# Patient Record
Sex: Male | Born: 1960 | Race: Black or African American | Hispanic: No | Marital: Married | Smoking: Never smoker
Health system: Southern US, Community
[De-identification: ages and names within clinical notes are randomized; demographics above are authoritative.]

## PROBLEM LIST (undated history)

## (undated) DIAGNOSIS — E7800 Pure hypercholesterolemia, unspecified: Secondary | ICD-10-CM

## (undated) DIAGNOSIS — I10 Essential (primary) hypertension: Secondary | ICD-10-CM

## (undated) HISTORY — PX: HERNIA REPAIR: SHX51

## (undated) HISTORY — DX: Pure hypercholesterolemia, unspecified: E78.00

## (undated) HISTORY — DX: Essential (primary) hypertension: I10

---

## 2006-04-12 ENCOUNTER — Encounter: Admission: RE | Admit: 2006-04-12 | Discharge: 2006-04-12 | Payer: Self-pay | Admitting: General Surgery

## 2006-04-14 ENCOUNTER — Encounter: Admission: RE | Admit: 2006-04-14 | Discharge: 2006-04-14 | Payer: Self-pay | Admitting: General Surgery

## 2006-04-30 ENCOUNTER — Ambulatory Visit: Payer: Self-pay | Admitting: Internal Medicine

## 2006-04-30 LAB — CONVERTED CEMR LAB: Sed Rate: 11 mm/hr (ref 0–20)

## 2009-02-04 IMAGING — CT CT CHEST W/ CM
2 of 3 series · 15 of 36 positions shown, 18 images · IV contrast (75CC OMNI 300)
Comparison: No prior chest CT. Two-view chest x-ray 04/02/2006 correlated.

CLINICAL DATA: Recent abnormal chest x-ray questioning mediastinal
lymphadenopathy. Smoker who fell 2 weeks ago and complains of left lateral chest
pain.

CT CHEST WITH CONTRAST  04/14/2006:
TECHNIQUE: Multidetector CT imaging of the chest and upper abdomen was
performed during bolus administration of intravenous contrast.
Contrast:  75 cc Omnipaque 300.

[Series 3: routine chest · axial · 0.62mm/px · z∈[-336,-66]mm · 12 of 64 slices shown, 15 images]
[im 5/64  mediastinal]
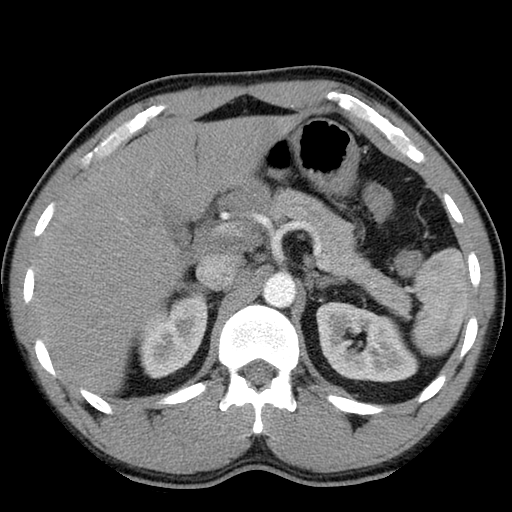
[im 5/64  lung]
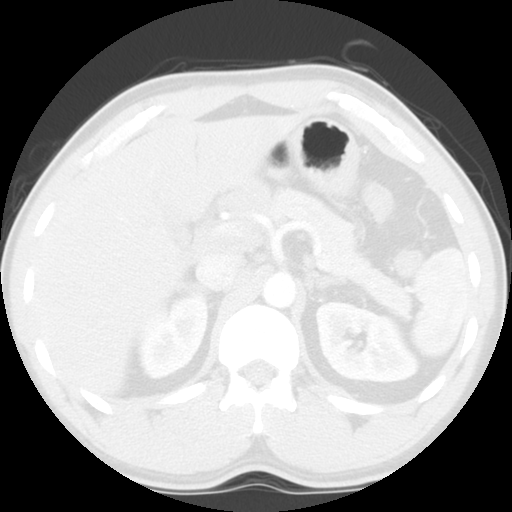
[im 10/64  lung]
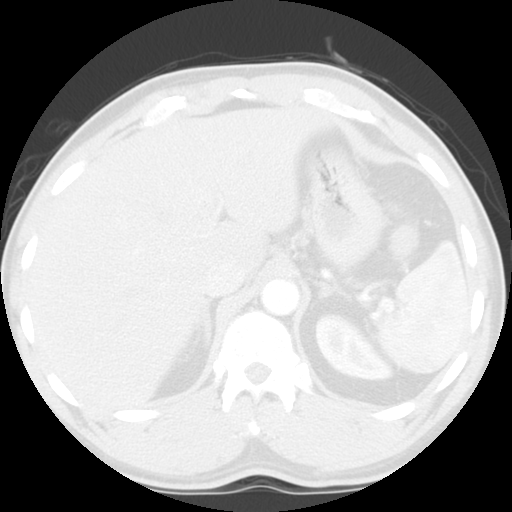
[im 15/64  lung]
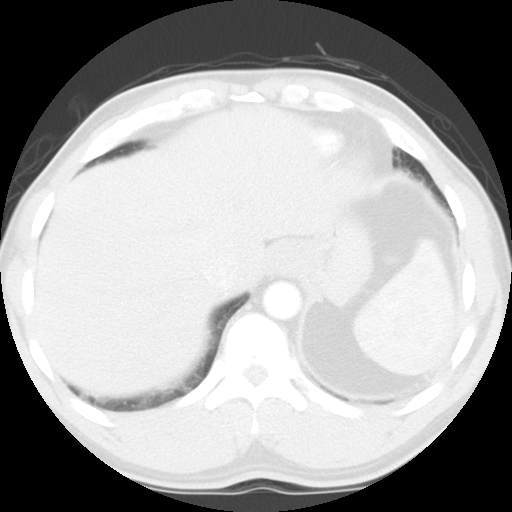
[im 19/64  lung]
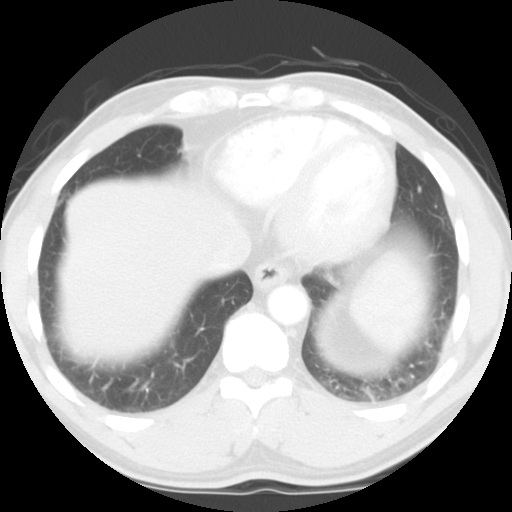
[im 24/64  mediastinal]
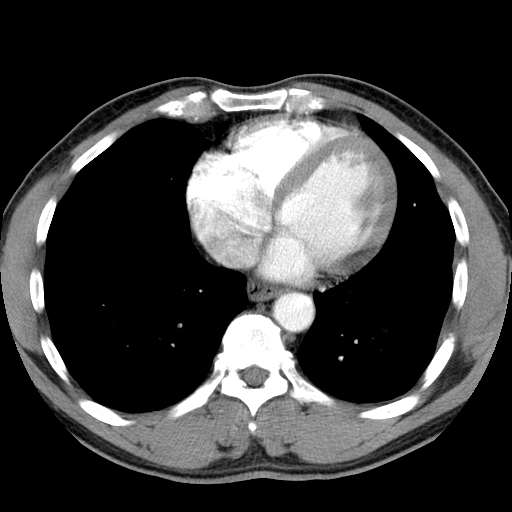
[im 24/64  lung]
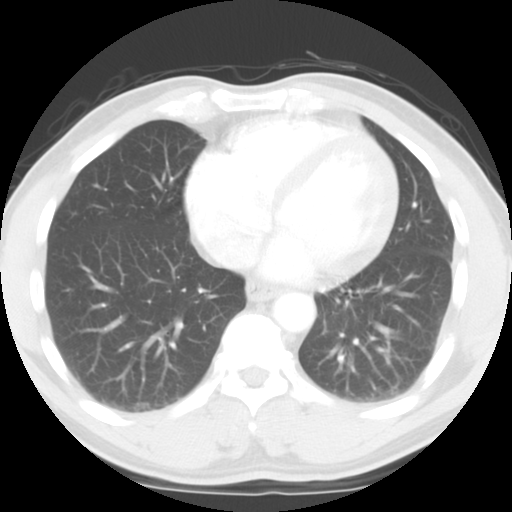
[im 29/64  lung]
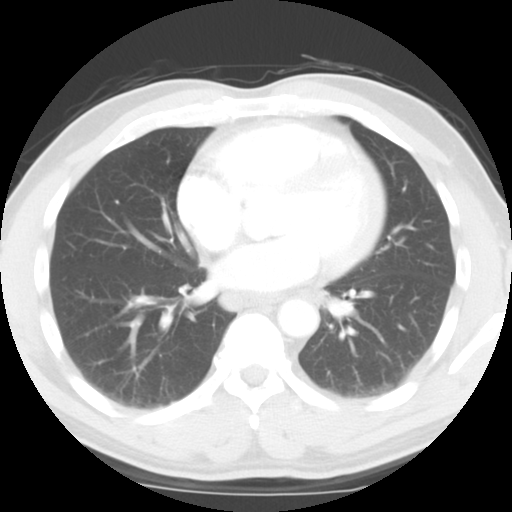
[im 36/64  lung]
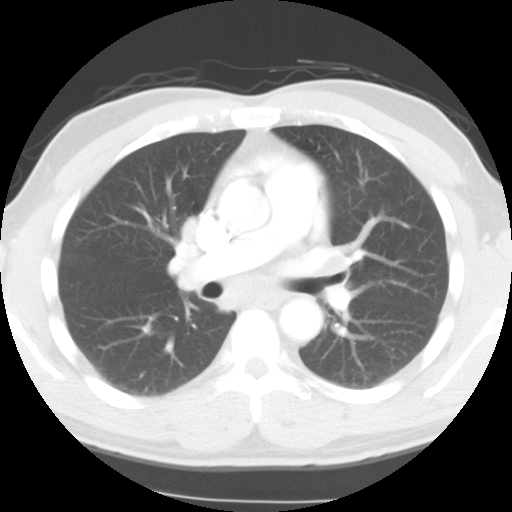
[im 40/64  lung]
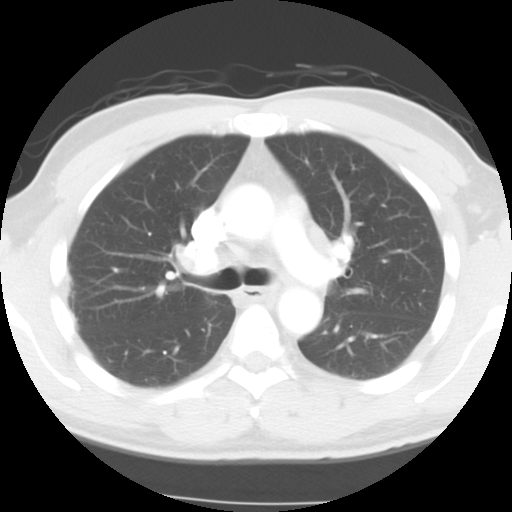
[im 45/64  mediastinal]
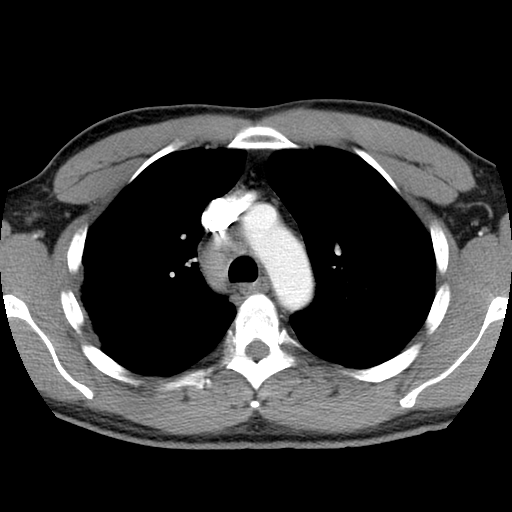
[im 45/64  lung]
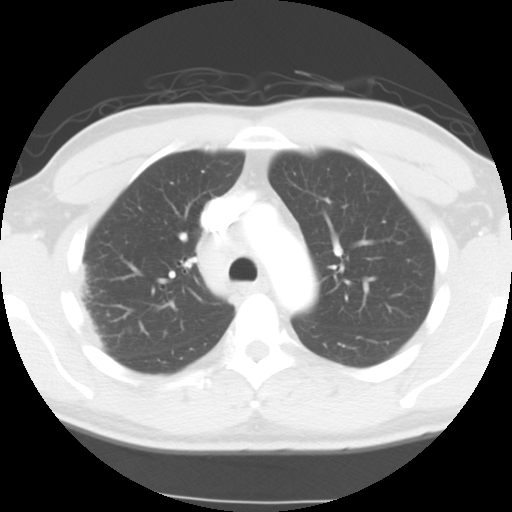
[im 50/64  lung]
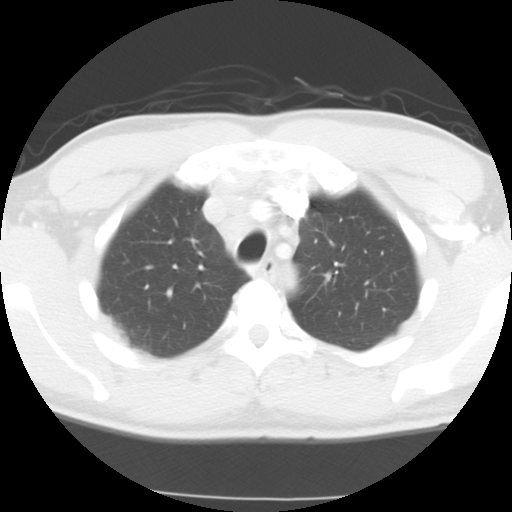
[im 54/64  lung]
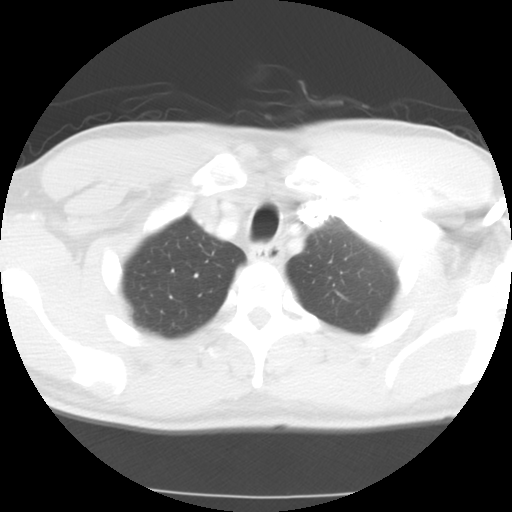
[im 59/64  lung]
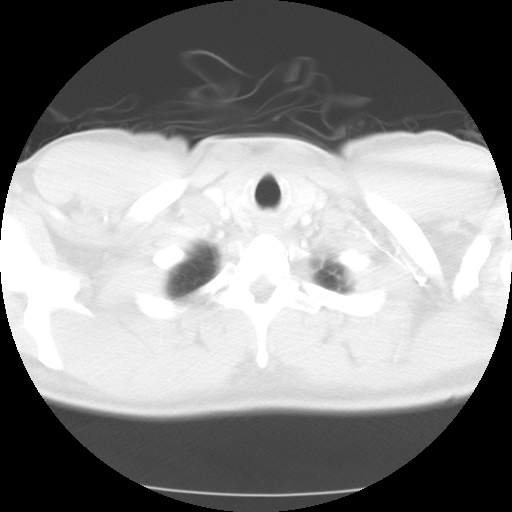

[Series 602: sagittal body · sagittal · 0.62mm/px · 3 of 129 slices shown]
[im 26/129  lung]
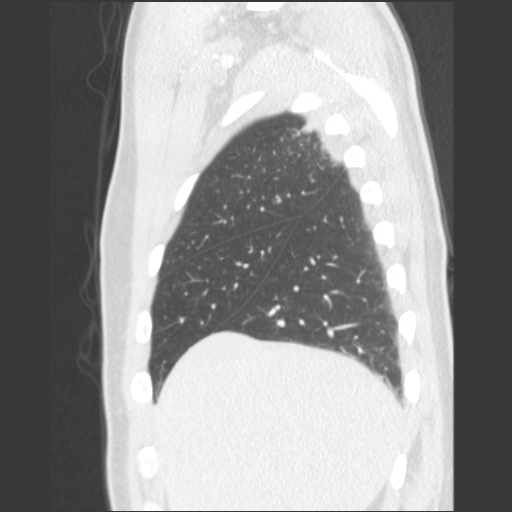
[im 52/129  lung]
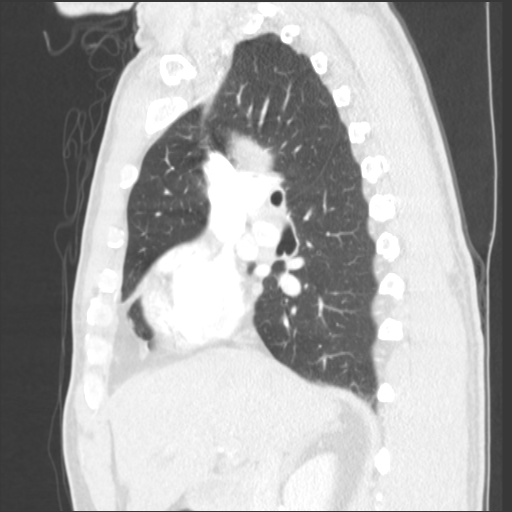
[im 77/129  lung]
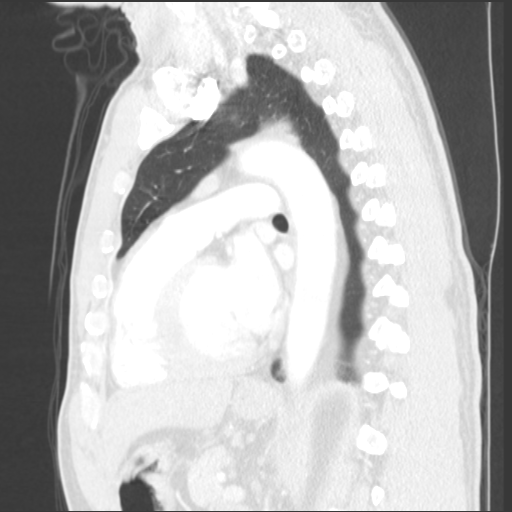

[15 of 36 positions shown; findings below may reference images not displayed]

FINDINGS: Enlarged right paratracheal lymph node measuring approximately 1.9 x
3.1 cm, corresponding to the abnormality on chest x-ray. Scattered enlarged
lymph nodes elsewhere in the mediastinum in the AP window and subcarinal region;
largest nodes in the subcarinal region measuring 1.8 x 2.9 cm. Enlarged
bilateral hilar lymph nodes, right greater than left; right hilar nodes
measuring approximately 2.8 x 2.4 cm. Enlarged azygoesophageal recess lymph node
to the right of midline measuring approximately 1.8 x 1.9 cm. Mildly enlarged
right axillary/subpectoral lymph node. No enlarged left axillary lymph nodes.

Peripheral interstitial opacity in the right upper lobe with associated mild
pleural thickening. Two adjacent subcentimeter nodular foci in the right upper
lobe. Third subcentimeter nodule more inferiorly in the right upper lobe. No
pulmonary nodules elsewhere. Expected dependent atelectasis in the lower lobes
posteriorly, left greater than right. No confluent airspace consolidation in
either lung.

Heart upper normal in size to borderline enlarged. No pericardial effusion. No
pleural effusions.

Enlarged gastrohepatic and hepatoduodenal ligament lymph nodes and porta hepatis
lymph nodes. Hepatoduodenal ligament nodes measuring approximately 3.3 x 2.2 cm,
adjacent to the head and body of the pancreas. Minimally enlarged
retroperitoneal lymph nodes within the visualized upper abdomen, all less than 2
cm in size. Very small hiatal hernia noted. Remaining visualized upper abdomen
unremarkable. Mild degenerative changes in the thoracic spine; no left rib
fractures identified.
IMPRESSION: 1. Lymphadenopathy within the mediastinum, both hila, the right
axillary/subpectoral region, and the upper abdomen as described. Index nodes are
measured above.
2. Peripheral interstitial opacity in the right upper lobe associated with
pleural thickening. There are at least 3 subcentimeter nodules within the right
upper lobe as well.
3. Borderline heart size.
4. Small hiatal hernia.

Peripheral opacities in the lungs can be seen with eosinophilic pneumonia. Given
the constellation of findings, an atypical infection and lymphoma would also be
in the differential diagnosis.

## 2009-07-05 ENCOUNTER — Emergency Department (HOSPITAL_BASED_OUTPATIENT_CLINIC_OR_DEPARTMENT_OTHER): Admission: EM | Admit: 2009-07-05 | Discharge: 2009-07-05 | Payer: Self-pay | Admitting: Emergency Medicine

## 2010-05-30 NOTE — Assessment & Plan Note (Signed)
[facility]                             PULMONARY OFFICE NOTE   NIRANJAN, RUFENER                         MRN:          [id number]  DATE:04/30/2006                            DOB:          October 05, 1960    REASON FOR CONSULTATION:  Abnormal chest x-ray.   HISTORY:  A very nice 50 year old black male, never smoker, who  presented with new left groin swelling, was diagnosed with inguinal  hernia and planned for surgery, but preoperative x-rays indicated  adenopathy, which, on CT scan, suggested sarcoid.  The patient is,  therefore, seen at Dr. Jacinto Halim request and his surgery has been  postponed.   The patient had no symptoms whatsoever.  He denies any unintended weight  loss, fevers, chills, sweats, viral-like illness or illnesses, and  denies any cough, dyspnea, myalgias, arthralgias, new skin rash, or  photophobia or floaters.   PAST MEDICAL HISTORY:  Significant for the absence of significant  medical problems or previous surgical history.   ALLERGIES:  None known.   MEDICATIONS:  None regularly.   SOCIAL HISTORY:  He has never smoked.  He works in Building surveyor.   FAMILY HISTORY:  Recorded in detail.  Significant for lung cancer in his  mother, negative for sarcoid or rheumatologic diseases or any  respiratory diseases.   REVIEW OF SYSTEMS:  Taken in detail on the worksheet, and negative  except as outlined above.   PHYSICAL EXAMINATION:  This is a healthy-appearing, ambulatory black  male in no acute distress.  He is afebrile with normal vital signs.  HEENT:  Unremarkable. Oropharynx clear.  Limited ocular exam was benign  with no evidence of facial sarcoid.  Ear canals clear bilaterally.  Dentition was intact.  NECK:  Supple without cervical adenopathy or tenderness.  Trachea is  midline, no thyromegaly.  Lungs fields are clear bilaterally to auscultation and percussion.  Heart is regular rhythm with murmur, gallop, or rubs present.   No  increase in P2  ABDOMEN:  Soft, benign.  EXTREMITIES:  Warm without calf tenderness, cyanosis, clubbing, or  edema.  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   Chest x-ray and CT scan were reviewed as outlined above.  There are no  baseline films.  CT scan dated April 14, 2006.  There is prominent  lymphadenopathy with a mediastinum both hyla and also right axillary.   CBC was normal.  Albumin to protein ratio appeared normal.  LFTs were  just minimally elevated.   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  In this setting, I would  not hesitate to recommend proceeding with surgery.  At the time of  surgery, if there is palpable adenopathy in the right axillae, I  certainly would consider doing an excisional biopsy of the lymph node,  but if not, I would simply follow him up here postoperatively in about 6  weeks.   I  did recommend an ACE level and sed rate for baseline purposes.   I spent extra time talking to the patient and his wife about the natural  history of sarcoid, emphasizing that it is typically a benign course in  most patients, that even when active, burns itself out over 3 years,  and frequently leaves patients with abnormal x-rays and CT scans as is  the case here (in other words, this could have been burned out sarcoid  from an illness he had many years ago).   The only way to know, of course, is to follow him over time or proceed  with a biopsy.  After discussing all the risks, benefits, and  alternatives and various approaches, he agreed to return in 6 weeks for  conservative followup.     Charlaine Dalton. Sherene Sires, MD, [facility]  Electronically Signed    MBW/MedQ  DD: 04/30/2006  DT: 05/01/2006  Job #: [id number]   cc:   Angelia Mould. Derrell Lolling, M.D.

## 2011-04-06 ENCOUNTER — Emergency Department (HOSPITAL_BASED_OUTPATIENT_CLINIC_OR_DEPARTMENT_OTHER)
Admission: EM | Admit: 2011-04-06 | Discharge: 2011-04-06 | Disposition: A | Discharge status: Home / Self Care | Payer: 59 | Attending: Emergency Medicine | Admitting: Emergency Medicine

## 2011-04-06 ENCOUNTER — Encounter (HOSPITAL_BASED_OUTPATIENT_CLINIC_OR_DEPARTMENT_OTHER): Payer: Self-pay | Admitting: *Deleted

## 2011-04-06 DIAGNOSIS — R51 Headache: Secondary | ICD-10-CM | POA: Insufficient documentation

## 2011-04-06 DIAGNOSIS — R000 Tachycardia, unspecified: Secondary | ICD-10-CM | POA: Insufficient documentation

## 2011-04-06 DIAGNOSIS — R5381 Other malaise: Secondary | ICD-10-CM | POA: Insufficient documentation

## 2011-04-06 DIAGNOSIS — R509 Fever, unspecified: Secondary | ICD-10-CM | POA: Insufficient documentation

## 2011-04-06 DIAGNOSIS — K529 Noninfective gastroenteritis and colitis, unspecified: Secondary | ICD-10-CM

## 2011-04-06 DIAGNOSIS — K5289 Other specified noninfective gastroenteritis and colitis: Secondary | ICD-10-CM | POA: Insufficient documentation

## 2011-04-06 DIAGNOSIS — R5383 Other fatigue: Secondary | ICD-10-CM | POA: Insufficient documentation

## 2011-04-06 LAB — COMPREHENSIVE METABOLIC PANEL
Albumin: 4.9 g/dL (ref 3.5–5.2)
Alkaline Phosphatase: 52 U/L (ref 39–117)
BUN: 14 mg/dL (ref 6–23)
Calcium: 10.4 mg/dL (ref 8.4–10.5)
Creatinine, Ser: 0.9 mg/dL (ref 0.50–1.35)
GFR calc Af Amer: >90 mL/min (ref >90)
Glucose, Bld: 118 mg/dL — ABNORMAL HIGH (ref 70–99)
Potassium: 4.3 mEq/L (ref 3.5–5.1)
Total Protein: 9.1 g/dL — ABNORMAL HIGH (ref 6.0–8.3)

## 2011-04-06 LAB — CBC
HCT: 48.7 % (ref 39.0–52.0)
Hemoglobin: 16.8 g/dL (ref 13.0–17.0)
MCV: 79.1 fL (ref 78.0–100.0)
RBC: 6.16 MIL/uL — ABNORMAL HIGH (ref 4.22–5.81)
RDW: 13.3 % (ref 11.5–15.5)
WBC: 9.2 10*3/uL (ref 4.0–10.5)

## 2011-04-06 LAB — DIFFERENTIAL
Basophils Absolute: 0 10*3/uL (ref 0.0–0.1)
Eosinophils Relative: 0 % (ref 0–5)
Lymphocytes Relative: 4 % — ABNORMAL LOW (ref 12–46)
Lymphs Abs: 0.4 10*3/uL — ABNORMAL LOW (ref 0.7–4.0)
Monocytes Absolute: 0.4 10*3/uL (ref 0.1–1.0)
Neutro Abs: 8.4 10*3/uL — ABNORMAL HIGH (ref 1.7–7.7)

## 2011-04-06 MED ORDER — ONDANSETRON 4 MG PO TBDP: 4.0000 mg | ORAL_TABLET | Freq: Three times a day (TID) | ORAL | Status: AC | PRN

## 2011-04-06 MED ORDER — SODIUM CHLORIDE 0.9 % IV BOLUS (SEPSIS)
1000.0000 mL | Freq: Once | INTRAVENOUS | Status: AC
  Administered 2011-04-06: 1000 mL via INTRAVENOUS

## 2011-04-06 MED ORDER — ONDANSETRON HCL 4 MG/2ML IJ SOLN
4.0000 mg | Freq: Once | INTRAMUSCULAR | Status: AC
  Administered 2011-04-06: 4 mg via INTRAVENOUS
  Filled 2011-04-06: qty 2

## 2011-04-06 NOTE — ED Provider Notes (Signed)
History     CSN: [id number]  Arrival date & time 04/06/11  1422   First MD Initiated Contact with Patient 04/06/11 1505      Chief Complaint  Patient presents with  . Emesis    (Consider location/radiation/quality/duration/timing/severity/associated sxs/prior treatment) HPI  Patient is 51 yo man with PMH of possible sarcoidosis, who presents with acute nausea, vomiting, diarrhea and abdominal pain. Per patient, he was fine during yesterday. He had dinner with his wife at [facility] yesterday.  He started having sever nausea, vomiting, diarrhea and mild abdominal pain at 1:00 AM. His wife is OK.  He vomited 4 to 5 times food materials without blood in it. He had proximately 10 to 15 times of watery bowel movements since his symptoms started without blood in stool. He took 2 pills of imodium at home without significant help. He has subjective fever and chills. He denies recent use of antibiotics.   Denies dizziness, palpitation and lightheadedness, cough, chest pain, SOB, dysuria, urgency, frequency, hematuria, joint pain or leg swelling.  History reviewed. No pertinent past medical history.  Past Surgical History  Procedure Date  . Hernia repair     No family history on file.  History  Substance Use Topics  . Smoking status: Never Smoker   . Smokeless tobacco: Not on file  . Alcohol Use: No    Review of Systems  Constitutional: Positive for fever and fatigue. Negative for chills and diaphoresis.  HENT: Negative for ear pain, congestion, sore throat, facial swelling, sneezing, trouble swallowing, voice change and sinus pressure.   Eyes: Negative for pain, discharge and visual disturbance.  Respiratory: Negative for apnea, cough, chest tightness, wheezing and stridor.   Cardiovascular: Negative for chest pain, palpitations and leg swelling.  Gastrointestinal: Positive for nausea, vomiting, abdominal pain and diarrhea. Negative for constipation, blood in stool,  abdominal distention and anal bleeding.  Genitourinary: Negative for dysuria, urgency and frequency.  Musculoskeletal: Negative for myalgias, back pain, joint swelling, arthralgias and gait problem.  Skin: Negative for wound.  Neurological: Positive for headaches. Negative for dizziness, tremors, seizures, syncope, light-headedness and numbness.  Hematological: Negative for adenopathy. Does not bruise/bleed easily.  Psychiatric/Behavioral: Negative for hallucinations, confusion, dysphoric mood and agitation.    Allergies  Review of patient's allergies indicates no known allergies.  Home Medications   Current Outpatient Rx  Name Route Sig Dispense Refill  . IMODIUM PO Oral Take 2 tablets by mouth daily as needed. Patient used this medication for diarrhea.      BP 130/84  Pulse 84  Temp(Src) 98.3 F (36.8 C) (Oral)  Resp 20  Ht 5\' 11"  (1.803 m)  Wt 195 lb (88.451 kg)  BMI 27.20 kg/m2  SpO2 100%  Physical Exam  General:  not in acute distress HEENT: PERRL, EOMI, no scleral icterus. Dry mucous and membrane. Cardiac: S1/S2, tachycardia, RRR, No murmurs, gallops or rubs Pulm: Good air movement bilaterally, Clear to auscultation bilaterally, No rales, wheezing, rhonchi or rubs. Abd: Soft,  nondistended, nontender, no rebound pain, no organomegaly, BS present Ext: No rashes or edema, 2+DP/PT pulse bilaterally Neuro: alert and oriented X3, cranial nerves II-XII grossly intact, muscle strength 5/5 in all extremeties,  sensation to light touch intact.   ED Course  Procedures (including critical care time)  Patient's symptoms are most likely caused by acute gastroenteritis. Etiology is likely to be viral infection, less likely to be bacterial origin given his watery diarrhea and lack of fever and bloody stool.  Patient was treated with zofran for nausea and also with 1000 ml bolus NS. Patient feels much better after treatment. His electrolytes are normal. No leukocytosis.  Patient  will be discharged home on Zofran and instructed to come back if his symptoms get worse.    Labs Reviewed  COMPREHENSIVE METABOLIC PANEL - Abnormal; Notable for the following:    Glucose, Bld 118 (*)    Total Protein 9.1 (*)    All other components within normal limits  CBC - Abnormal; Notable for the following:    RBC 6.16 (*)    All other components within normal limits  DIFFERENTIAL - Abnormal; Notable for the following:    Neutrophils Relative 92 (*)    Neutro Abs 8.4 (*)    Lymphocytes Relative 4 (*)    Lymphs Abs 0.4 (*)    All other components within normal limits   No results found.   No diagnosis found.   MDM          Lorretta Harp, MD 04/06/11 [phone]

## 2011-04-06 NOTE — Discharge Instructions (Signed)
1. Please take the medication for nausea as prescribed.  2. If you have worsening of your symptoms or new symptoms arise, please go to the ER immediately if symptoms are severe.  Diet for Diarrhea, Adult Having frequent, runny stools (diarrhea) has many causes. Diarrhea may be caused or worsened by food or drink. Diarrhea may be relieved by changing your diet. IF YOU ARE NOT TOLERATING SOLID FOODS:  Drink enough water and fluids to keep your urine clear or pale yellow.   Avoid sugary drinks and sodas as well as milk-based beverages.   Avoid beverages containing caffeine and alcohol.   You may try rehydrating beverages. You can make your own by following this recipe:    tsp table salt.    tsp baking soda.   ? tsp salt substitute (potassium chloride).   1 tbs + 1 tsp sugar.   1 qt water.  As your stools become more solid, you can start eating solid foods. Add foods one at a time. If a certain food causes your diarrhea to get worse, avoid that food and try other foods. A low fiber, low-fat, and lactose-free diet is recommended. Small, frequent meals may be better tolerated.  Starches  Allowed:  White, [place], and pita breads, plain rolls, buns, bagels. Plain muffins, matzo. Soda, saltine, or graham crackers. Pretzels, melba toast, zwieback. Cooked cereals made with water: cornmeal, farina, cream cereals. Dry cereals: refined corn, wheat, rice. Potatoes prepared any way without skins, refined macaroni, spaghetti, noodles, refined rice.   Avoid:  Bread, rolls, or crackers made with whole wheat, multi-grains, rye, bran seeds, nuts, or coconut. Corn tortillas or taco shells. Cereals containing whole grains, multi-grains, bran, coconut, nuts, or raisins. Cooked or dry oatmeal. Coarse wheat cereals, granola. Cereals advertised as "high-fiber." Potato skins. Whole grain pasta, wild or brown rice. Popcorn. Sweet potatoes/yams. Sweet rolls, doughnuts, waffles, pancakes, sweet breads.   Vegetables  Allowed: Strained tomato and vegetable juices. Most well-cooked and canned vegetables without seeds. Fresh: Tender lettuce, cucumber without the skin, cabbage, spinach, bean sprouts.   Avoid: Fresh, cooked, or canned: Artichokes, baked beans, beet greens, broccoli, Brussels sprouts, corn, kale, legumes, peas, sweet potatoes. Cooked: Green or red cabbage, spinach. Avoid large servings of any vegetables, because vegetables shrink when cooked, and they contain more fiber per serving than fresh vegetables.  Fruit  Allowed: All fruit juices except prune juice. Cooked or canned: Apricots, applesauce, cantaloupe, cherries, fruit cocktail, grapefruit, grapes, kiwi, mandarin oranges, peaches, pears, plums, watermelon. Fresh: Apples without skin, ripe banana, grapes, cantaloupe, cherries, grapefruit, peaches, oranges, plums. Keep servings limited to  cup or 1 piece.   Avoid: Fresh: Apple with skin, apricots, mango, pears, raspberries, strawberries. Prune juice, stewed or dried prunes. Dried fruits, raisins, dates. Large servings of all fresh fruits.  Meat and Meat Substitutes  Allowed: Ground or well-cooked tender beef, ham, veal, lamb, pork, or poultry. Eggs, plain cheese. Fish, oysters, shrimp, lobster, other seafoods. Liver, organ meats.   Avoid: Tough, fibrous meats with gristle. Peanut butter, smooth or chunky. Cheese, nuts, seeds, legumes, dried peas, beans, lentils.  Milk  Allowed: Yogurt, lactose-free milk, kefir, drinkable yogurt, buttermilk, soy milk.   Avoid: Milk, chocolate milk, beverages made with milk, such as milk shakes.  Soups  Allowed: Bouillon, broth, or soups made from allowed foods. Any strained soup.   Avoid: Soups made from vegetables that are not allowed, cream or milk-based soups.  Desserts and Sweets  Allowed: Sugar-free gelatin, sugar-free frozen ice pops made without sugar  alcohol.   Avoid: Plain cakes and cookies, pie made with allowed fruit, pudding,  custard, cream pie. Gelatin, fruit, ice, sherbet, frozen ice pops. Ice cream, ice milk without nuts. Plain hard candy, honey, jelly, molasses, syrup, sugar, chocolate syrup, gumdrops, marshmallows.  Fats and Oils  Allowed: Avoid any fats and oils.   Avoid: Seeds, nuts, olives, avocados. Margarine, butter, cream, mayonnaise, salad oils, plain salad dressings made from allowed foods. Plain gravy, crisp bacon without rind.  Beverages  Allowed: Water, decaffeinated teas, oral rehydration solutions, sugar-free beverages.   Avoid: Fruit juices, caffeinated beverages (coffee, tea, soda or pop), alcohol, sports drinks, or lemon-lime soda or pop.  Condiments  Allowed: Ketchup, mustard, horseradish, vinegar, cream sauce, cheese sauce, cocoa powder. Spices in moderation: allspice, basil, bay leaves, celery powder or leaves, cinnamon, cumin powder, curry powder, ginger, mace, marjoram, onion or garlic powder, oregano, paprika, parsley flakes, ground pepper, rosemary, sage, savory, tarragon, thyme, turmeric.   Avoid: Coconut, honey.  Weight Monitoring: Weigh yourself every day. You should weigh yourself in the morning after you urinate and before you eat breakfast. Wear the same amount of clothing when you weigh yourself. Record your weight daily. Bring your recorded weights to your clinic visits. Tell your caregiver right away if you have gained 3 lb/1.4 kg or more in 1 day, 5 lb/2.3 kg in a week, or whatever amount you were told to report. SEEK IMMEDIATE MEDICAL CARE IF:   You are unable to keep fluids down.   You start to throw up (vomit) or diarrhea keeps coming back (persistent).   Abdominal pain develops, increases, or can be felt in one place (localizes).   You have an oral temperature above 102 F (38.9 C), not controlled by medicine.   Diarrhea contains blood or mucus.   You develop excessive weakness, dizziness, fainting, or extreme thirst.  MAKE SURE YOU:   Understand these  instructions.   Will watch your condition.   Will get help right away if you are not doing well or get worse.  Document Released: 03/21/2003 Document Revised: 12/18/2010 Document Reviewed: 07/12/2008 [facility] Patient Information 2012 [place], [place].

## 2011-04-06 NOTE — ED Notes (Signed)
Woke at 1am with diarrhea. Vomiting started a couple of hours later. Feels weak.

## 2011-04-07 NOTE — ED Provider Notes (Signed)
I saw and evaluated the patient, reviewed the resident's note and I agree with the findings and plan.   .Face to face Exam:  General:  Awake HEENT:  Atraumatic Resp:  Normal effort Abd:  Nondistended Neuro:No focal weakness Lymph: No adenopathy   Nelia Shi, MD 04/07/11 1246

## 2013-08-17 ENCOUNTER — Encounter (HOSPITAL_BASED_OUTPATIENT_CLINIC_OR_DEPARTMENT_OTHER): Payer: Self-pay | Admitting: Emergency Medicine

## 2013-08-17 ENCOUNTER — Emergency Department (HOSPITAL_BASED_OUTPATIENT_CLINIC_OR_DEPARTMENT_OTHER)
Admission: EM | Admit: 2013-08-17 | Discharge: 2013-08-17 | Disposition: A | Discharge status: Home / Self Care | Payer: 59 | Attending: Emergency Medicine | Admitting: Emergency Medicine

## 2013-08-17 DIAGNOSIS — X58XXXA Exposure to other specified factors, initial encounter: Secondary | ICD-10-CM | POA: Insufficient documentation

## 2013-08-17 DIAGNOSIS — S01511A Laceration without foreign body of lip, initial encounter: Secondary | ICD-10-CM

## 2013-08-17 DIAGNOSIS — Y929 Unspecified place or not applicable: Secondary | ICD-10-CM | POA: Diagnosis not present

## 2013-08-17 DIAGNOSIS — Y9389 Activity, other specified: Secondary | ICD-10-CM | POA: Insufficient documentation

## 2013-08-17 DIAGNOSIS — S01501A Unspecified open wound of lip, initial encounter: Secondary | ICD-10-CM | POA: Insufficient documentation

## 2013-08-17 MED ORDER — AMOXICILLIN-POT CLAVULANATE 875-125 MG PO TABS: 1.0000 | ORAL_TABLET | Freq: Two times a day (BID) | ORAL | Status: AC
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
## 2013-08-17 NOTE — Discharge Instructions (Signed)
Facial Laceration  A facial laceration is a cut on the face. These injuries can be painful and cause bleeding. Lacerations usually heal quickly, but they need special care to reduce scarring. DIAGNOSIS  Your health care provider will take a medical history, ask for details about how the injury occurred, and examine the wound to determine how deep the cut is. TREATMENT  Some facial lacerations may not require closure. Others may not be able to be closed because of an increased risk of infection. The risk of infection and the chance for successful closure will depend on various factors, including the amount of time since the injury occurred. The wound may be cleaned to help prevent infection. If closure is appropriate, pain medicines may be given if needed. Your health care provider will use stitches (sutures), wound glue (adhesive), or skin adhesive strips to repair the laceration. These tools bring the skin edges together to allow for faster healing and a better cosmetic outcome. If needed, you may also be given a tetanus shot. HOME CARE INSTRUCTIONS  Only take over-the-counter or prescription medicines as directed by your health care provider.  Follow your health care provider's instructions for wound care. These instructions will vary depending on the technique used for closing the wound. For Sutures:  Keep the wound clean and dry.   If you were given a bandage (dressing), you should change it at least once a day. Also change the dressing if it becomes wet or dirty, or as directed by your health care provider.   Wash the wound with soap and water 2 times a day. Rinse the wound off with water to remove all soap. Pat the wound dry with a clean towel.   After cleaning, apply a thin layer of the antibiotic ointment recommended by your health care provider. This will help prevent infection and keep the dressing from sticking.   You may shower as usual after the first 24 hours. Do not soak the  wound in water until the sutures are removed.   Get your sutures removed as directed by your health care provider. With facial lacerations, sutures should usually be taken out after 4-5 days to avoid stitch marks.   Wait a few days after your sutures are removed before applying any makeup. For Skin Adhesive Strips:  Keep the wound clean and dry.   Do not get the skin adhesive strips wet. You may bathe carefully, using caution to keep the wound dry.   If the wound gets wet, pat it dry with a clean towel.   Skin adhesive strips will fall off on their own. You may trim the strips as the wound heals. Do not remove skin adhesive strips that are still stuck to the wound. They will fall off in time.  For Wound Adhesive:  You may briefly wet your wound in the shower or bath. Do not soak or scrub the wound. Do not swim. Avoid periods of heavy sweating until the skin adhesive has fallen off on its own. After showering or bathing, gently pat the wound dry with a clean towel.   Do not apply liquid medicine, cream medicine, ointment medicine, or makeup to your wound while the skin adhesive is in place. This may loosen the film before your wound is healed.   If a dressing is placed over the wound, be careful not to apply tape directly over the skin adhesive. This may cause the adhesive to be pulled off before the wound is healed.   Avoid   prolonged exposure to sunlight or tanning lamps while the skin adhesive is in place.  The skin adhesive will usually remain in place for 5-10 days, then naturally fall off the skin. Do not pick at the adhesive film.  After Healing: Once the wound has healed, cover the wound with sunscreen during the day for 1 full year. This can help minimize scarring. Exposure to ultraviolet light in the first year will darken the scar. It can take 1-2 years for the scar to lose its redness and to heal completely.  SEEK IMMEDIATE MEDICAL CARE IF:  You have redness, pain, or  swelling around the wound.   You see ayellowish-white fluid (pus) coming from the wound.   You have chills or a fever.  MAKE SURE YOU:  Understand these instructions.  Will watch your condition.  Will get help right away if you are not doing well or get worse. Document Released: 02/06/2004 Document Revised: 10/19/2012 Document Reviewed: 08/11/2012 [employer] Patient Information 2015 [employer]. This information is not intended to replace advice given to you by your health care provider. Make sure you discuss any questions you have with your health care provider.  

## 2013-08-17 NOTE — ED Provider Notes (Signed)
LACERATION REPAIR Performed by: Saralyn PilarKaramalegos, Jeanee Fabre Authorized by: Saralyn PilarKaramalegos, Shimshon Narula Consent: Verbal consent obtained. Risks and benefits: risks, benefits and alternatives were discussed Consent given by: patient Patient identity confirmed: provided demographic data Prepped and Draped in normal sterile fashion Wound explored and flushed with saline.  Laceration Location: Right upper lip, outside vermilion border  Laceration Length: 1 cm  No Foreign Bodies seen or palpated  Anesthesia: local infiltration  Local anesthetic: lidocaine with epinephrine  Anesthetic total: 5 ml  Irrigation method: syringe Amount of cleaning: standard  Skin closure: achieved  Number of sutures: 2 sutures, 6-0 prolene  Technique: simple interrupted  Patient tolerance: Patient tolerated the procedure well with no immediate complications.  Saralyn PilarAlexander Ithan Touhey, DO [facility], PGY-2  Saralyn PilarAlexander Mindel Friscia, DO 08/17/13 [phone]
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
## 2013-08-17 NOTE — ED Provider Notes (Signed)
I saw and evaluated the patient, reviewed the resident's note and I agree with the findings and plan.   EKG Interpretation None        Rolan BuccoMelanie Chaska Hagger, MD 08/17/13 1451

## 2022-03-17 ENCOUNTER — Other Ambulatory Visit: Payer: Self-pay

## 2022-03-17 ENCOUNTER — Emergency Department (HOSPITAL_BASED_OUTPATIENT_CLINIC_OR_DEPARTMENT_OTHER)
Admission: EM | Admit: 2022-03-17 | Discharge: 2022-03-17 | Disposition: A | Discharge status: Home / Self Care | Payer: BC Managed Care – PPO | Attending: Emergency Medicine | Admitting: Emergency Medicine

## 2022-03-17 ENCOUNTER — Encounter (HOSPITAL_BASED_OUTPATIENT_CLINIC_OR_DEPARTMENT_OTHER): Payer: Self-pay | Admitting: Emergency Medicine

## 2022-03-17 ENCOUNTER — Emergency Department (HOSPITAL_BASED_OUTPATIENT_CLINIC_OR_DEPARTMENT_OTHER): Payer: BC Managed Care – PPO

## 2022-03-17 DIAGNOSIS — I10 Essential (primary) hypertension: Secondary | ICD-10-CM | POA: Diagnosis not present

## 2022-03-17 DIAGNOSIS — Z1152 Encounter for screening for COVID-19: Secondary | ICD-10-CM | POA: Diagnosis not present

## 2022-03-17 DIAGNOSIS — R519 Headache, unspecified: Secondary | ICD-10-CM | POA: Diagnosis present

## 2022-03-17 DIAGNOSIS — Z79899 Other long term (current) drug therapy: Secondary | ICD-10-CM | POA: Diagnosis not present

## 2022-03-17 LAB — RESP PANEL BY RT-PCR (RSV, FLU A&B, COVID)  RVPGX2
Influenza A by PCR: NEGATIVE
Influenza B by PCR: NEGATIVE
Resp Syncytial Virus by PCR: NEGATIVE
SARS Coronavirus 2 by RT PCR: NEGATIVE

## 2022-03-17 NOTE — ED Triage Notes (Signed)
Positional headache and every time he coughs or sneeze, he reports . Hx HTN , no further neuro deficit .

## 2022-03-17 NOTE — ED Provider Notes (Signed)
[facility] Provider Note   CSN: [id number] Arrival date & time: 03/17/22  0941     History  Chief Complaint  Patient presents with   Headache    Scott Mack is a 62 y.o. male.   Headache   62 year old male presents emergency department with complaints of headache.  Patient states that headache is been present for approximate the past week.  States that headache is associated with positional changes such as lying down or standing up from a seated/lying down position.  States the headache is in the frontal region and described as "sharp".  States the headache lasts for a matter of seconds before resolving spontaneously.  Denies history of headaches and is concerned because of the amount of time having experienced headaches.  Denies fever, chills, cough, congestion, visual changes, weakness/sensory deficits in upper or lower extremities, slurred speech, facial droop, gait abnormality.  Patient currently not experiencing headache; requesting CT scan.  Past medical history significant for hypertension, hyperlipidemia  Home Medications Prior to Admission medications   Medication Sig Start Date End Date Taking? Authorizing Provider  amoxicillin-clavulanate (AUGMENTIN) 875-125 MG per tablet Take 1 tablet by mouth 2 (two) times daily. One po bid x 7 days 08/17/13   Malvin Johns, MD  Loperamide HCl (IMODIUM PO) Take 2 tablets by mouth daily as needed. Patient used this medication for diarrhea.    [provider]      Allergies    Patient has no known allergies.    Review of Systems   Review of Systems  Neurological:  Positive for headaches.  All other systems reviewed and are negative.   Physical Exam Updated Vital Signs BP 111/63   Pulse (!) 57   Temp 97.9 F (36.6 C) (Oral)   Resp 16   Wt 96.2 kg   SpO2 95%   BMI 29.57 kg/m  Physical Exam Vitals and nursing note reviewed.  Constitutional:      General: He is not in  acute distress.    Appearance: He is well-developed.  HENT:     Head: Normocephalic and atraumatic.  Eyes:     Conjunctiva/sclera: Conjunctivae normal.  Cardiovascular:     Rate and Rhythm: Normal rate and regular rhythm.     Heart sounds: No murmur heard. Pulmonary:     Effort: Pulmonary effort is normal. No respiratory distress.     Breath sounds: Normal breath sounds.  Abdominal:     Palpations: Abdomen is soft.     Tenderness: There is no abdominal tenderness.  Musculoskeletal:        General: No swelling.     Cervical back: Neck supple.  Skin:    General: Skin is warm and dry.     Capillary Refill: Capillary refill takes less than 2 seconds.  Neurological:     Mental Status: He is alert.     Comments: Alert and oriented to self, place, time and event.   Speech is fluent, clear without dysarthria or dysphasia.   Strength 5/5 in upper/lower extremities   Sensation intact in upper/lower extremities   Normal gait.  Negative Romberg. No pronator drift.  Normal finger-to-nose and feet tapping.  CN I not tested  CN II not tested CN III, IV, VI PERRLA and EOMs intact bilaterally  CN V Intact sensation to sharp and light touch to the face  CN VII facial movements symmetric  CN VIII not tested  CN IX, X no uvula deviation, symmetric rise  of soft palate  CN XI 5/5 SCM and trapezius strength bilaterally  CN XII Midline tongue protrusion, symmetric L/R movements     Psychiatric:        Mood and Affect: Mood normal.     ED Results / Procedures / Treatments   Labs (all labs ordered are listed, but only abnormal results are displayed) Labs Reviewed  RESP PANEL BY RT-PCR (RSV, FLU A&B, COVID)  [id number]    EKG None  Radiology CT Head Wo Contrast  Result Date: 03/17/2022 CLINICAL DATA:  Headache, new onset (Age >= 59y) EXAM: CT HEAD WITHOUT CONTRAST TECHNIQUE: Contiguous axial images were obtained from the base of the skull through the vertex without intravenous contrast.  RADIATION DOSE REDUCTION: This exam was performed according to the departmental dose-optimization program which includes automated exposure control, adjustment of the mA and/or kV according to patient size and/or use of iterative reconstruction technique. COMPARISON:  None Available. FINDINGS: Brain: No evidence of acute infarction, hemorrhage, hydrocephalus, extra-axial collection or mass lesion/mass effect. Vascular: No hyperdense vessel or unexpected calcification. Skull: Normal. Negative for fracture or focal lesion. Sinuses/Orbits: No acute finding. Other: None. IMPRESSION: No acute intracranial abnormality. Electronically Signed   By: Davina Poke D.O.   On: 03/17/2022 10:35    Procedures Procedures    Medications Ordered in ED Medications - No data to display  ED Course/ Medical Decision Making/ A&P                             Medical Decision Making Amount and/or Complexity of Data Reviewed Radiology: ordered.   This patient presents to the ED for concern of headache, this involves an extensive number of treatment options, and is a complaint that carries with it a high risk of complications and morbidity.  The differential diagnosis includes CVA, cerebral venous thrombosis, meningitis/encephalitis, electrolyte abnormalities, pseudotumor cerebri, malignancy, migraine/cluster/tension headache carotid/vertebral artery dissection, temporal arteritis  Co morbidities that complicate the patient evaluation  See HPI   Additional history obtained:  Additional history obtained from EMR External records from outside source obtained and reviewed including hospital records   Lab Tests:  I Ordered, and personally interpreted labs.  The pertinent results include: Respiratory viral panel negative for COVID, flu RSV   Imaging Studies ordered:  I ordered imaging studies including CT head I independently visualized and interpreted imaging which showed no acute intracranial process I  agree with the radiologist interpretation   Cardiac Monitoring: / EKG:  The patient was maintained on a cardiac monitor.  I personally viewed and interpreted the cardiac monitored which showed an underlying rhythm of: Sinus rhythm   Consultations Obtained:  N/a   Problem List / ED Course / Critical interventions / Medication management  Headache Reevaluation of the patient showed that the patient stayed the same I have reviewed the patients home medicines and have made adjustments as needed   Social Determinants of Health:  Denies tobacco, illicit drug use   Test / Admission - Considered:  Headache Vitals signs within normal range and stable throughout visit. Laboratory/imaging studies significant for: See above Patient's workup today overall reassuring.  Patient with intermittent headache mainly related to positional changes without acute neurologic deficit.  Upon further review, patient with URI type illness prior to onset of headache.  Patient without headache while in the emergency department besides when standing from a seated position for a matter of a few seconds before symptoms resolved spontaneously.  CT scan was obtained at patient request which was negative for any acute abnormalities.  Doubt CVA, cerebral venous thrombosis, carotid/vertebral artery dissection, giant cell arteritis.  Patient recommended follow-up with family doctor regarding complaint of headache as well as taking Tylenol/Motrin as needed in the interim.  Patient also given follow-up information for neurology if headaches persist.  Treatment plan discussed at length with patient and he acknowledged understanding was agreeable to said plan. Worrisome signs and symptoms were discussed with the patient, and the patient acknowledged understanding to return to the ED if noticed. Patient was stable upon discharge.          Final Clinical Impression(s) / ED Diagnoses Final diagnoses:  Acute  nonintractable headache, unspecified headache type    Rx / DC Orders ED Discharge Orders     None         Wilnette Kales, [place] 03/17/22 1100    Regan Lemming, MD 03/17/22 1146

## 2022-03-17 NOTE — Discharge Instructions (Addendum)
Note the workup today was overall reassuring.  CT scan was negative for any acute abnormalities. Recommend follow up with family doctor regarding headache. Attached is information for neurology if headaches become recurrent.  You may take Tylenol/Motrin as needed for headaches.  Please do not hesitate to return to emergency department for worrisome signs and symptoms we discussed become apparent.

## 2022-07-15 ENCOUNTER — Other Ambulatory Visit: Payer: Self-pay

## 2022-07-15 ENCOUNTER — Emergency Department (HOSPITAL_BASED_OUTPATIENT_CLINIC_OR_DEPARTMENT_OTHER)
Admission: EM | Admit: 2022-07-15 | Discharge: 2022-07-15 | Disposition: A | Discharge status: Home / Self Care | Payer: BC Managed Care – PPO | Attending: Emergency Medicine | Admitting: Emergency Medicine

## 2022-07-15 ENCOUNTER — Encounter (HOSPITAL_BASED_OUTPATIENT_CLINIC_OR_DEPARTMENT_OTHER): Payer: Self-pay | Admitting: Emergency Medicine

## 2022-07-15 DIAGNOSIS — J029 Acute pharyngitis, unspecified: Secondary | ICD-10-CM | POA: Insufficient documentation

## 2022-07-15 DIAGNOSIS — Z1152 Encounter for screening for COVID-19: Secondary | ICD-10-CM | POA: Insufficient documentation

## 2022-07-15 LAB — GROUP A STREP BY PCR: Group A Strep by PCR: NOT DETECTED

## 2022-07-15 LAB — SARS CORONAVIRUS 2 BY RT PCR: SARS Coronavirus 2 by RT PCR: NEGATIVE

## 2022-07-15 MED ORDER — DEXAMETHASONE SODIUM PHOSPHATE 10 MG/ML IJ SOLN
6.0000 mg | Freq: Once | INTRAMUSCULAR | Status: AC
  Administered 2022-07-15: 6 mg via INTRAMUSCULAR
  Filled 2022-07-15: qty 1

## 2022-07-15 MED ORDER — KETOROLAC TROMETHAMINE 60 MG/2ML IM SOLN
30.0000 mg | Freq: Once | INTRAMUSCULAR | Status: AC
  Administered 2022-07-15: 30 mg via INTRAMUSCULAR
  Filled 2022-07-15: qty 2

## 2022-07-15 NOTE — ED Notes (Signed)
Discharge instructions provided by edp were discussed with pt. Pt verbalized understanding with no additional questions at this time.  

## 2022-07-15 NOTE — ED Triage Notes (Signed)
Patient POV w/ complaints of dry cough and states today his symptoms have progressed and he now has a sore throat since 9 am. Endorses congestion. Denies fevers chills, shortness of breath, chest pain. Aox4.

## 2022-07-15 NOTE — ED Provider Notes (Signed)
[facility] Provider Note   CSN: [id number] Arrival date & time: 07/15/22  1842     History  Chief Complaint  Patient presents with   Sore Throat    Scott Mack is a 62 y.o. male presented to ED complaining of a dry cough, sore throat, onset yesterday.  Denies headaches, fevers, chills, body aches, nausea, vomiting, diarrhea.  Denies sick contacts in the house.  HPI     Home Medications Prior to Admission medications   Medication Sig Start Date End Date Taking? Authorizing Provider  amoxicillin-clavulanate (AUGMENTIN) 875-125 MG per tablet Take 1 tablet by mouth 2 (two) times daily. One po bid x 7 days 08/17/13   Rolan Bucco, MD  Loperamide HCl (IMODIUM PO) Take 2 tablets by mouth daily as needed. Patient used this medication for diarrhea.    [provider]      Allergies    Patient has no known allergies.    Review of Systems   Review of Systems  Physical Exam Updated Vital Signs BP 118/65 (BP Location: Left Arm)   Pulse 76   Temp 99.8 F (37.7 C) (Oral)   Resp 17   Ht 5\' 11"  (1.803 m)   Wt 96.2 kg   SpO2 96%   BMI 29.57 kg/m  Physical Exam Constitutional:      General: He is not in acute distress. HENT:     Head: Normocephalic and atraumatic.     Mouth/Throat:     Mouth: No oral lesions.     Pharynx: Uvula midline. Posterior oropharyngeal erythema present. No pharyngeal swelling, oropharyngeal exudate or uvula swelling.  Eyes:     Conjunctiva/sclera: Conjunctivae normal.     Pupils: Pupils are equal, round, and reactive to light.  Cardiovascular:     Rate and Rhythm: Normal rate and regular rhythm.  Pulmonary:     Effort: Pulmonary effort is normal. No respiratory distress.  Abdominal:     General: There is no distension.     Tenderness: There is no abdominal tenderness.  Skin:    General: Skin is warm and dry.  Neurological:     General: No focal deficit present.     Mental Status: He is  alert. Mental status is at baseline.  Psychiatric:        Mood and Affect: Mood normal.        Behavior: Behavior normal.     ED Results / Procedures / Treatments   Labs (all labs ordered are listed, but only abnormal results are displayed) Labs Reviewed  SARS CORONAVIRUS 2 BY RT PCR  GROUP A STREP BY PCR    EKG None  Radiology No results found.  Procedures Procedures    Medications Ordered in ED Medications  dexamethasone (DECADRON) injection 6 mg (has no administration in time range)  ketorolac (TORADOL) injection 30 mg (has no administration in time range)    ED Course/ Medical Decision Making/ A&P                             Medical Decision Making Risk Prescription drug management.   Patient is here with dry cough and sore throat.  COVID test and rapid strep test were obtained in triage are negative.  Do not see evidence otherwise of strep pharyngitis on exam, given his cough, I suspect likely viral etiology.  No evidence of peritonsillar abscess.  No indication for blood test or CT  imaging at this time.  No indication for antibiotics.  We did discuss symptomatic management with IM Decadron and Toradol which the patient would prefer at this time.  We also discussed contagion precautions at home, washing his hands and wearing a facemask if he is at a party gathering.  He verbalized understanding.  Okay for discharge        Final Clinical Impression(s) / ED Diagnoses Final diagnoses:  Viral pharyngitis    Rx / DC Orders ED Discharge Orders     None         Terald Sleeper, MD 07/15/22 2159
